# Patient Record
Sex: Female | Born: 1970 | Race: White | Hispanic: No | Marital: Married | Smoking: Never smoker
Health system: Southern US, Community
[De-identification: ages and names within clinical notes are randomized; demographics above are authoritative.]

## PROBLEM LIST (undated history)

## (undated) DIAGNOSIS — E039 Hypothyroidism, unspecified: Secondary | ICD-10-CM

## (undated) DIAGNOSIS — Z8619 Personal history of other infectious and parasitic diseases: Secondary | ICD-10-CM

## (undated) DIAGNOSIS — O09529 Supervision of elderly multigravida, unspecified trimester: Secondary | ICD-10-CM

## (undated) HISTORY — DX: Supervision of elderly multigravida, unspecified trimester: O09.529

## (undated) HISTORY — DX: Personal history of other infectious and parasitic diseases: Z86.19

## (undated) HISTORY — DX: Hypothyroidism, unspecified: E03.9

---

## 2008-01-06 ENCOUNTER — Encounter: Admission: RE | Admit: 2008-01-06 | Discharge: 2008-01-06 | Payer: Self-pay | Admitting: Family Medicine

## 2008-03-27 ENCOUNTER — Encounter: Admission: RE | Admit: 2008-03-27 | Discharge: 2008-03-27 | Payer: Self-pay | Admitting: Family Medicine

## 2009-02-12 ENCOUNTER — Encounter: Admission: RE | Admit: 2009-02-12 | Discharge: 2009-02-12 | Payer: Self-pay | Admitting: Gastroenterology

## 2009-08-23 ENCOUNTER — Ambulatory Visit (HOSPITAL_COMMUNITY): Admission: RE | Admit: 2009-08-23 | Discharge: 2009-08-23 | Payer: Self-pay | Admitting: Obstetrics and Gynecology

## 2010-12-18 ENCOUNTER — Inpatient Hospital Stay (HOSPITAL_COMMUNITY)
Admission: AD | Admit: 2010-12-18 | Discharge: 2010-12-22 | DRG: 371 | Disposition: A | Discharge status: Home / Self Care | Payer: BC Managed Care – PPO | Source: Ambulatory Visit | Attending: Obstetrics and Gynecology | Admitting: Obstetrics and Gynecology

## 2010-12-18 DIAGNOSIS — O09519 Supervision of elderly primigravida, unspecified trimester: Secondary | ICD-10-CM | POA: Diagnosis present

## 2010-12-18 DIAGNOSIS — O480 Post-term pregnancy: Principal | ICD-10-CM | POA: Diagnosis present

## 2010-12-18 LAB — CBC
HCT: 37.5 % (ref 36.0–46.0)
Hemoglobin: 12.6 g/dL (ref 12.0–15.0)
MCH: 32.5 pg (ref 26.0–34.0)
MCHC: 33.6 g/dL (ref 30.0–36.0)
MCV: 96.6 fL (ref 78.0–100.0)

## 2010-12-19 ENCOUNTER — Inpatient Hospital Stay (HOSPITAL_COMMUNITY): Admission: AD | Admit: 2010-12-19 | Payer: Self-pay | Admitting: Obstetrics and Gynecology

## 2010-12-20 LAB — CBC
HCT: 34.3 % — ABNORMAL LOW (ref 36.0–46.0)
MCHC: 33.2 g/dL (ref 30.0–36.0)
MCV: 96.3 fL (ref 78.0–100.0)
RDW: 13.1 % (ref 11.5–15.5)

## 2010-12-21 NOTE — Op Note (Signed)
Stacey, Cuevas NO.:  [id number]  MEDICAL RECORD NO.:  [id number]           PATIENT TYPE:  I  LOCATION:  9146                          FACILITY:  [facility]  PHYSICIAN:  Dineen Kid. Rana Snare, M.D.    DATE OF BIRTH:  [birth year]  DATE OF PROCEDURE:  12/19/2010 DATE OF DISCHARGE:                              OPERATIVE REPORT   PREOPERATIVE DIAGNOSIS:  Intrauterine pregnancy at [redacted] weeks gestational age, failure to progress with arrest of dilation.  POSTOPERATIVE DIAGNOSIS:  Intrauterine pregnancy at [redacted] weeks gestational age, failure to progress with arrest of dilation, occiput posterior presentation.  PROCEDURE:  Primary low-segment transverse cesarean section.  SURGEON:  Dineen Kid. Rana Snare, MD  ANESTHESIA:  Epidural.  INDICATIONS:  [name] is a 40 year old who was induced 40-1/7th weeks. She underwent a two-stage induction.  She progressed to 4 cm dilated and despite 5-6 hours of adequate labor, she had no further progression beyond 4 cm dilated.  Because of failure to progress with arrest of dilation, we proceeded with primary low-segment transverse cesarean sections.  The risks and benefits were discussed.  Informed consent was obtained cancer.  FINDINGS AT THE TIME OF SURGERY:  A viable female infant, Apgars were 8 and 9, pH arterial 7.34, weight was 8 pounds and 7 ounces.  The baby was in the occiput posterior presentation.  DESCRIPTION OF PROCEDURE:  After adequate analgesia, the patient was placed in the supine position with left lateral tilt.  She was sterilely prepped and draped.  The bladder was sterilely drained with Foley catheter.  A Pfannenstiel skin incision was made 2 fingerbreadths above the pubic symphysis, taken down sharply to fascia, which was incised transversely, extended superiorly and inferiorly off the bellies.  The rectus muscle was separated sharply in the midline.  Peritoneum was entered sharply.  Bladder flap was created and a  self-retaining ring was placed.  A low-segment myotomy incision was made down to the amniotic sac, extended laterally with the operator's fingertips.  The infant's vertex was delivered through the myotomy incision and easily delivered with vacuum extractor.  The nares and pharynx were then suctioned.  The infant was delivered, cord clamped and cut, and handed to pediatricians for resuscitation.  Cord blood was obtained.  Placenta was extracted manually.  Uterus was exteriorized and wiped clean with a dry lap.  The myotomy incision was closed in 2 layers, first being running locking layer with the second being imbricating layer of 0 Monocryl suture. Uterus was massaged until firm given Pitocin, was also given IM Methergine 0.2 mg to achieve good uterine tone.  Uterus was placed back into the abdominal cavity and after copious amount of irrigation, adequate hemostasis was assured.  The peritoneum was closed with 0 Monocryl suture.  Rectus muscle was plicated in the midline.  Irrigation was applied and after adequate hemostasis, the fascia was then closed with a #1 Vicryl in a running fashion.  Irrigation was applied and after adequate hemostasis, the skin stapled.  The patient was tolerated the procedure well, was stable, and transferred to recovery room.  The sponge and  instrument counts were normal x3.  Estimated blood loss 800 mL.  The patient received 1 g of cefotetan preoperatively.     Dineen Kid Rana Snare, M.D.     DCL/MEDQ  D:  12/19/2010  T:  12/20/2010  Job:  [id number]  Electronically Signed by Candice Camp M.D. on 12/21/2010 11:34:08 AM

## 2011-02-03 NOTE — Discharge Summary (Signed)
Stacey Cuevas, Stacey Cuevas NO.:  [id number]  MEDICAL RECORD NO.:  [id number]           PATIENT TYPE:  I  LOCATION:  9146                          FACILITY:  [facility]  PHYSICIAN:  Guy Sandifer. Henderson Cloud, M.D. DATE OF BIRTH:  April 04, 1971  DATE OF ADMISSION:  12/19/2010 DATE OF DISCHARGE:  12/22/2010                              DISCHARGE SUMMARY   ADMITTING DIAGNOSES: 1. Intrauterine pregnancy at 40-1/2 weeks' estimated gestational age. 2. Induction of labor secondary to postdates.  DISCHARGE DIAGNOSES: 1. Status post low transverse cesarean section secondary to failure to     progress. 2. Viable female infant.  PROCEDURE:  Low transverse cesarean section.  REASON FOR ADMISSION:  Please see written H and P.  HOSPITAL COURSE:  The patient is a 40 year old primigravida that was admitted to [facility] for induction of labor secondary to postdates.  Pregnancy had otherwise been uncomplicated.  On the morning of admission, fetal heart tones were in the 140s with nice acceleration and contractions were approximately every 2-4 minutes. Cervix was examined and found to be 1-2 cm, 70% effaced, vertex at -2 station.  Artificial rupture of membranes was performed which revealed clear fluid.  The patient was started on Pitocin and epidural was administered for her comfort.  Later that evening, there was no further change in the cervix beyond 4 cm and adequate labor had been determined by intrauterine pressure catheter.  Decision was made to proceed with a primary low transverse cesarean section.  The patient was then transferred to the operating room where epidural was dosed to an adequate surgical level.  A low transverse incision was made with delivery of a viable female infant weighing 8 pounds and 7 ounces with Apgars of 8 at 1 minute and 9 at 5 minutes.  Arterial cord pH was 7.34. The patient tolerated this well and was taken to the recovery room in stable condition.   On postoperative day #1, the patient was without complaint.  Vital signs were stable.  Abdominal dressing was noted to be clean, dry, and intact.  Laboratory findings showed hemoglobin of 11.4. On postoperative day #2, the patient was without complaint.  Vital signs remained stable.  Fundus was firm and nontender.  Incision was clean, dry, and intact.  She is ambulating well.  On postoperative day #3, the patient was without complaint.  Vital signs remained stable.  Fundus was firm and nontender.  Staples were removed and the patient was later discharged home.  CONDITION ON DISCHARGE:  Stable.  DIET:  Regular as tolerated.  ACTIVITY:  No heavy lifting, no driving x2 weeks, no vaginal entry.  FOLLOWUP:  The patient is follow up in the office in 1-2 weeks for an incision check.  She is to call for temperature greater than 100 degrees, persistent nausea and vomiting, heavy vaginal bleeding, and/or redness or drainage from incisional site.  DISCHARGE MEDICATIONS:  Percocet 5-325, #30, 1 p.o. every 4-6 h. p.r.n., Motrin 600 mg every 6 hours, and prenatal vitamins 1 p.o. daily.     Julio Sicks, N.P.   ______________________________ Guy Sandifer. Henderson Cloud,  M.D.    CC/MEDQ  D:  12/22/2010  T:  12/23/2010  Job:  [id number]  Electronically Signed by Julio Sicks N.P. on 12/24/2010 08:57:07 AM Electronically Signed by Harold Hedge M.D. on 02/03/2011 01:44:34 PM

## 2012-03-25 ENCOUNTER — Other Ambulatory Visit: Payer: Self-pay

## 2012-03-25 LAB — OB RESULTS CONSOLE HEPATITIS B SURFACE ANTIGEN: Hepatitis B Surface Ag: NEGATIVE

## 2012-03-25 LAB — OB RESULTS CONSOLE RUBELLA ANTIBODY, IGM: Rubella: IMMUNE

## 2012-03-25 LAB — OB RESULTS CONSOLE ABO/RH: RH Type: POSITIVE

## 2012-03-25 LAB — OB RESULTS CONSOLE ANTIBODY SCREEN: Antibody Screen: NEGATIVE

## 2012-04-06 ENCOUNTER — Encounter: Payer: Self-pay | Admitting: Obstetrics and Gynecology

## 2012-08-22 ENCOUNTER — Other Ambulatory Visit (HOSPITAL_COMMUNITY): Payer: Self-pay | Admitting: Obstetrics and Gynecology

## 2012-08-22 DIAGNOSIS — O283 Abnormal ultrasonic finding on antenatal screening of mother: Secondary | ICD-10-CM

## 2012-08-22 DIAGNOSIS — O2690 Pregnancy related conditions, unspecified, unspecified trimester: Secondary | ICD-10-CM

## 2012-08-25 ENCOUNTER — Encounter (HOSPITAL_COMMUNITY): Payer: Self-pay

## 2012-08-25 ENCOUNTER — Ambulatory Visit (HOSPITAL_COMMUNITY)
Admission: RE | Admit: 2012-08-25 | Discharge: 2012-08-25 | Disposition: A | Discharge status: Home / Self Care | Payer: Commercial Managed Care - PPO | Source: Ambulatory Visit | Attending: Obstetrics | Admitting: Obstetrics

## 2012-08-25 ENCOUNTER — Ambulatory Visit (HOSPITAL_COMMUNITY)
Admission: RE | Admit: 2012-08-25 | Discharge: 2012-08-25 | Disposition: A | Discharge status: Home / Self Care | Payer: Commercial Managed Care - PPO | Source: Ambulatory Visit | Attending: Obstetrics and Gynecology | Admitting: Obstetrics and Gynecology

## 2012-08-25 VITALS — BP 116/61 | HR 89 | Wt 166.5 lb

## 2012-08-25 DIAGNOSIS — O283 Abnormal ultrasonic finding on antenatal screening of mother: Secondary | ICD-10-CM

## 2012-08-25 DIAGNOSIS — IMO0002 Reserved for concepts with insufficient information to code with codable children: Secondary | ICD-10-CM

## 2012-08-25 DIAGNOSIS — O34219 Maternal care for unspecified type scar from previous cesarean delivery: Secondary | ICD-10-CM | POA: Insufficient documentation

## 2012-08-25 DIAGNOSIS — E039 Hypothyroidism, unspecified: Secondary | ICD-10-CM | POA: Insufficient documentation

## 2012-08-25 DIAGNOSIS — O2690 Pregnancy related conditions, unspecified, unspecified trimester: Secondary | ICD-10-CM

## 2012-08-25 DIAGNOSIS — O358XX0 Maternal care for other (suspected) fetal abnormality and damage, not applicable or unspecified: Secondary | ICD-10-CM | POA: Insufficient documentation

## 2012-08-25 DIAGNOSIS — E079 Disorder of thyroid, unspecified: Secondary | ICD-10-CM | POA: Insufficient documentation

## 2012-08-25 DIAGNOSIS — O09529 Supervision of elderly multigravida, unspecified trimester: Secondary | ICD-10-CM | POA: Insufficient documentation

## 2012-08-25 DIAGNOSIS — Z363 Encounter for antenatal screening for malformations: Secondary | ICD-10-CM | POA: Insufficient documentation

## 2012-08-25 DIAGNOSIS — Z1389 Encounter for screening for other disorder: Secondary | ICD-10-CM | POA: Insufficient documentation

## 2012-08-25 NOTE — Progress Notes (Signed)
[facility] ultrasound  Indication: 41 yr old G2P1001 at [redacted]w[redacted]d for ultrasound secondary to finding of enlarged cavum septum pellucidum on outside ultrasound.  Findings: 1. Single intrauterine pregnancy. 2. Estimated fetal weight is in the 52nd%. 3. Anterior placenta with a posterior accessory lobe. 4.  Normal amniotic fluid volume. 5. The anatomic survey is limited by advanced gestational age. No abnormalities were seen. The cavum septum pellucidum looks prominent but likely a normal variant.  Recommendations: 1. Appropriate fetal growth. 2. See consult letter. 3. Advanced maternal age: - patient met with genetic counselor; see separate report - had normal MaterniT21 screen 4. Recommend follow up in 6 weeks to reevaluate the fetal brain.  Eulis Foster, MD

## 2012-08-25 NOTE — Consult Note (Signed)
MFM consult  41 yr old G2P1001 at [redacted]w[redacted]d referred by Dr. Rana Snare for ultrasound and consult secondary to finding of enlarged cavum septum pellucidum on outside ultrasound.   Ultrasound today shows: estimated fetal weight is in the 52nd%. Anterior placenta with a posterior accessory lobe. Normal amniotic fluid volume. The anatomic survey is limited by advanced gestational age. No abnormalities were seen. The cavum septum pellucidum looks prominent but likely a normal variant.    I discussed the findings of the ultrasound with the patient and counseled her as follows:   1. Appropriate fetal growth.  2. Discussed that while the cavum septum pellucidum looks prominent on ultrasound today I am not aware of limits of normal fetal measurements nor am I aware of any association with an "enlarged" cavum septum pellucidum. Discussed that this is not "water on the brain" as that typically refers to ventriculomegaly and I discussed the ventricles appear normal on today's ultrasound. Discussed there are remote possibilities of an intracranial cyst but the appearance of this structure is consistent with the cavum septum pellucidum. Will reevaluate in 6 weeks. 3. Advanced maternal age:  - patient met with genetic counselor; see separate report  - had normal MaterniT21 screen - recommend repeat fetal growth in 4-6 weeks - recommend antenatal testing with twice weekly nonstress tests and weekly amniotic fluid index starting at [redacted] weeks gestation  4. Recommend follow up in 6 weeks to reevaluate the fetal brain.   I spent 40 minutes in face to face consultation with the patient in addition to time spent on the ultrasound.  Eulis Foster, MD

## 2012-08-29 NOTE — Progress Notes (Signed)
Genetic Counseling  High-Risk Gestation Note  Appointment Date:  08/25/2012 Referred By: Stacey Hedge MD Date of Birth:  07-Jan-1971    Pregnancy History: G2P1001 Estimated Date of Delivery: 11/01/12 Estimated Gestational Age: [redacted]w[redacted]d Attending: Eulis Foster, MD  [name], and her husband were seen for genetic counseling regarding a maternal age of 41 y.o. and previous ultrasound finding of enlarged cavum septum pellucidum.   Ultrasound performed at the time of today confirmed the pregnancy to be [redacted]w[redacted]d gestation. The cavum septum pellucidum was visualized to appear prominent but expected to be likely a normal variant. Complete ultrasound results and MFM consult note reported separately. A follow-up ultrasound was recommended to be offered to reevaluate the fetal brain in 6 weeks. We are happy to perform this in our office, if desired. No follow-up appointments were made in our office at this time.   We discussed that an absence cavum septum pellucidum is associated with an increased risk for fetal aneuploidy. However, an enlarged cavum septum pellucidum has not been found to have the same association. We reviewed with the couple that the appearance of the cavum septum pellucidum on today's ultrasound is not associated with increased fluid in the fetal brain and is most likely a normal variant. The patient inquired about additional fluid on the fetal brain. We reviewed that this typically refers to ventriculomegaly or hydrocephalus, which is not visualized in the current pregnancy. Follow-up ultrasound is available in 6 weeks to reassess fetal brain anatomy.    We reviewed maternal age and the association with risk for chromosome conditions due to nondisjunction with aging of the ova.  They were counseled that the risk for aneuploidy decreases as gestational age increases, accounting for those pregnancies which spontaneously abort.  We specifically discussed Down syndrome (trisomy 34), trisomies  60 and 74, and sex chromosome aneuploidies (47,XXX and 47,XXY) including the common features and prognoses of each. [name] previously have noninvasive prenatal testing (NIPT) performed through her OB office (MaterniT21), which was negative for the conditions screened. We reviewed that NIPT analyzes cell free fetal DNA found in the maternal circulation. This test is not diagnostic for chromosome conditions, but can provide information regarding the presence or absence of extra fetal DNA for chromosomes 13, 18 and 21. Thus, it would not identify or rule out all fetal aneuploidy. The reported detection rate is greater than 99% for Trisomy 21, greater than 97% for Trisomy 18, and is approximately 91% for Trisomy 13. The false positive rate is reported to be less than 0.1% for any of these conditions. In addition, we discussed that ~50-80% of fetuses with Down syndrome and up to 90-95% of fetuses with trisomy 18/13, when well visualized, have detectable anomalies or soft markers by detailed ultrasound (~18+ weeks gestation).   This couple was also counseled regarding the option of diagnostic testing via amniocentesis. We reviewed the approximate 1 in 300-500 risk for complications, including spontaneous preterm labor and delivery at the current gestational age. We reviewed that amniocentesis typically assess for fetal karyotype. Single gene conditions are typically not tested for via amniocentesis unless ultrasound findings and/or family history suggest a risk for a particular genetic condition, for which prenatal diagnosis is available. Thus, amniocentesis would not identify or rule out all genetic conditions. After consideration of all the options, they declined amniocentesis at this time.   Both family histories were reviewed and found to be contributory for dry skin/eczema for the father of the pregnancy, the couple's 11 month old  son, and paternal relatives for the father of the pregnancy. The father of  the pregnancy reportedly has eczema and asthma. Eczema has been associated with asthma and allergies. Though the underlying cause is currently not known, eczema has been suggested to follow autosomal dominant inheritance with variable expressivity, in some cases. This means, in some families, an individual with eczema has a 1 in 2 (50%) chance of passing on the gene change that could lead to eczema in offspring, though not all who inherit the gene change would develop the same symptoms. Eczema can also be seen as part of an underlying genetic syndrome. Given this, the chance for eczema, asthma, and/or allergies in the current pregnancy could be up to 50%. We discussed that prenatal screening or testing for eczema, asthma, or allergies is not available in the absence of a known genetic cause. It would be helpful for the patient to inform her pediatrician of this history so that her child(ren) can be screened and followed appropriately. Without further information regarding the provided family history, an accurate genetic risk cannot be calculated. Further genetic counseling is warranted if more information is obtained.  [name] denied exposure to environmental toxins or chemical agents. She denied the use of alcohol, tobacco or street drugs. She denied significant viral illnesses during the course of her pregnancy. Her medical and surgical histories were noncontributory.    I counseled this couple regarding the above risks and available options.  The approximate face-to-face time with the genetic counselor was 20 minutes.  Stacey Plowman, MS Certified Genetic Counselor  08/29/2012

## 2012-10-06 ENCOUNTER — Ambulatory Visit (HOSPITAL_COMMUNITY)
Admission: RE | Admit: 2012-10-06 | Discharge: 2012-10-06 | Disposition: A | Discharge status: Home / Self Care | Payer: 59 | Source: Ambulatory Visit | Attending: Obstetrics | Admitting: Obstetrics

## 2012-10-06 DIAGNOSIS — O34219 Maternal care for unspecified type scar from previous cesarean delivery: Secondary | ICD-10-CM | POA: Insufficient documentation

## 2012-10-06 DIAGNOSIS — O283 Abnormal ultrasonic finding on antenatal screening of mother: Secondary | ICD-10-CM

## 2012-10-06 DIAGNOSIS — O358XX0 Maternal care for other (suspected) fetal abnormality and damage, not applicable or unspecified: Secondary | ICD-10-CM | POA: Insufficient documentation

## 2012-10-06 DIAGNOSIS — E079 Disorder of thyroid, unspecified: Secondary | ICD-10-CM | POA: Insufficient documentation

## 2012-10-06 DIAGNOSIS — O09529 Supervision of elderly multigravida, unspecified trimester: Secondary | ICD-10-CM | POA: Insufficient documentation

## 2012-10-06 DIAGNOSIS — IMO0002 Reserved for concepts with insufficient information to code with codable children: Secondary | ICD-10-CM

## 2012-10-06 DIAGNOSIS — O99280 Endocrine, nutritional and metabolic diseases complicating pregnancy, unspecified trimester: Secondary | ICD-10-CM | POA: Insufficient documentation

## 2012-10-06 DIAGNOSIS — E039 Hypothyroidism, unspecified: Secondary | ICD-10-CM | POA: Insufficient documentation

## 2012-10-06 LAB — OB RESULTS CONSOLE GBS: GBS: NEGATIVE

## 2012-10-06 IMAGING — US US OB FOLLOW-UP
1 series · 12 of 28 positions shown · non-contrast
Comparison: none

[Series 1: us ob follow-up · 0.22mm/px · 12 of 42 slices shown]
[im 2/42]
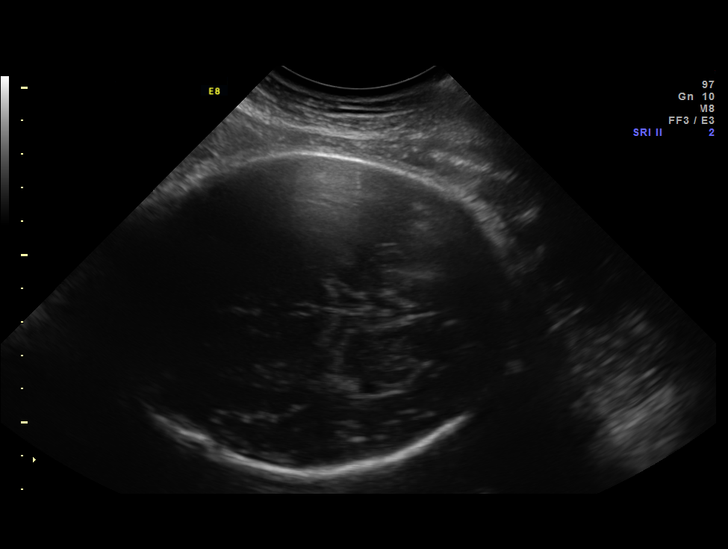
[im 5/42]
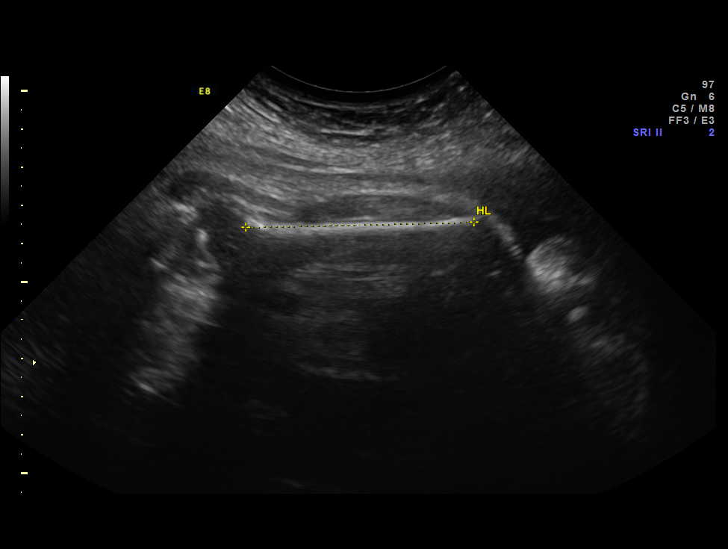
[im 8/42]
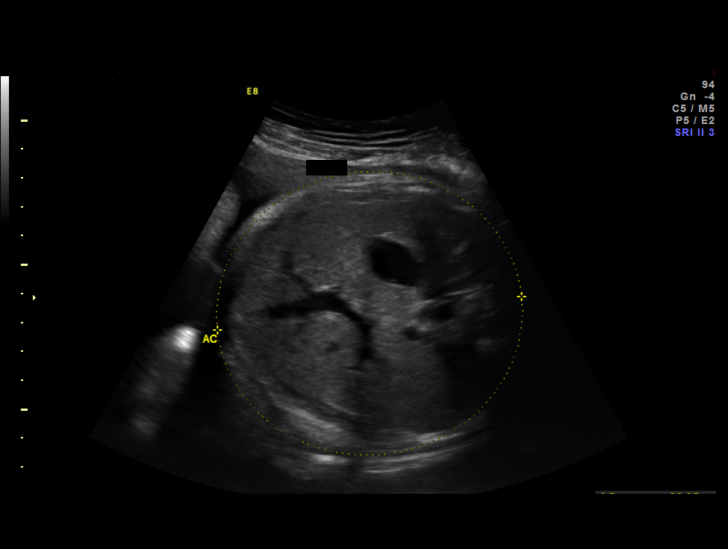
[im 13/42]
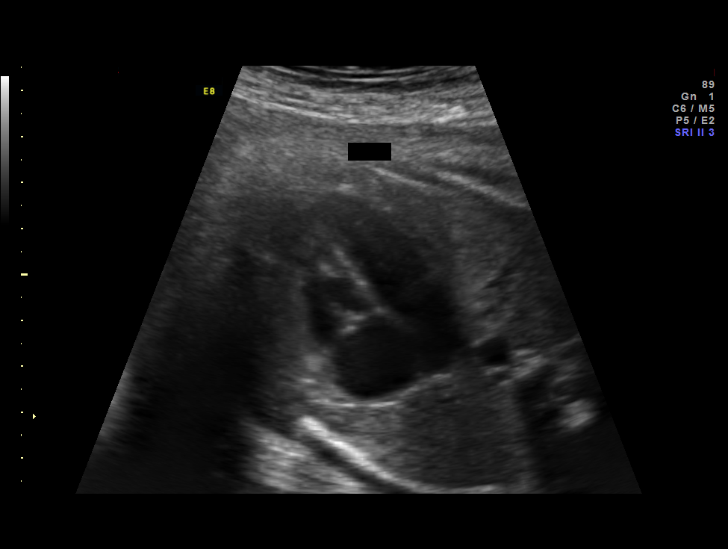
[im 16/42]
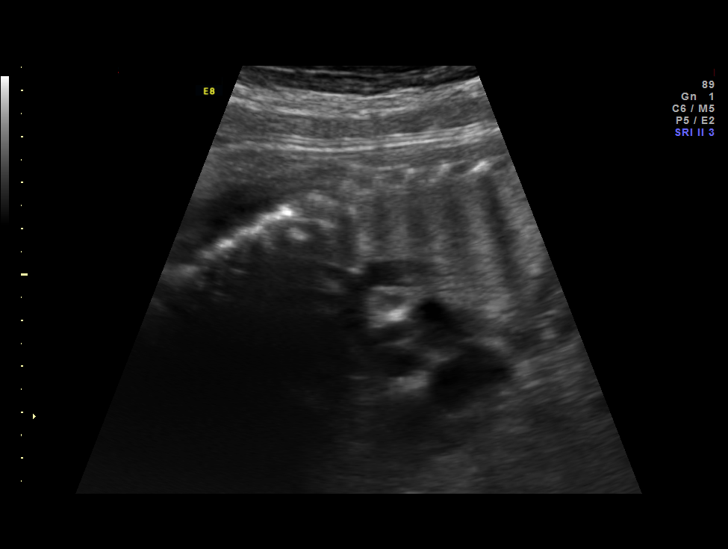
[im 19/42]
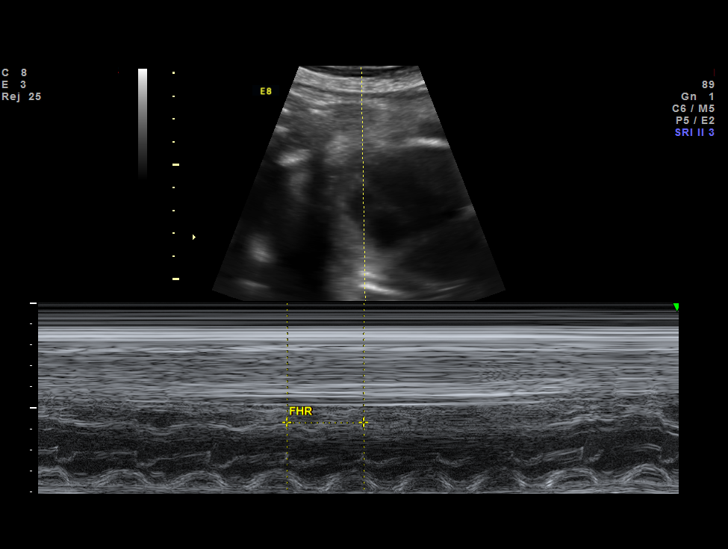
[im 23/42]
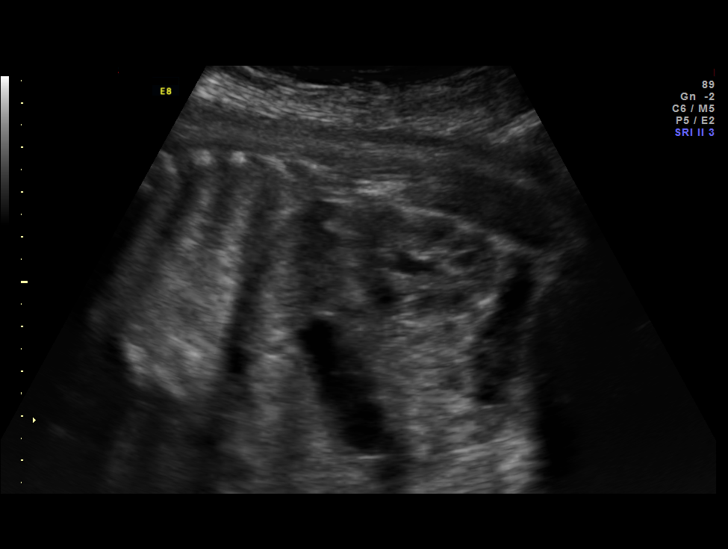
[im 26/42]
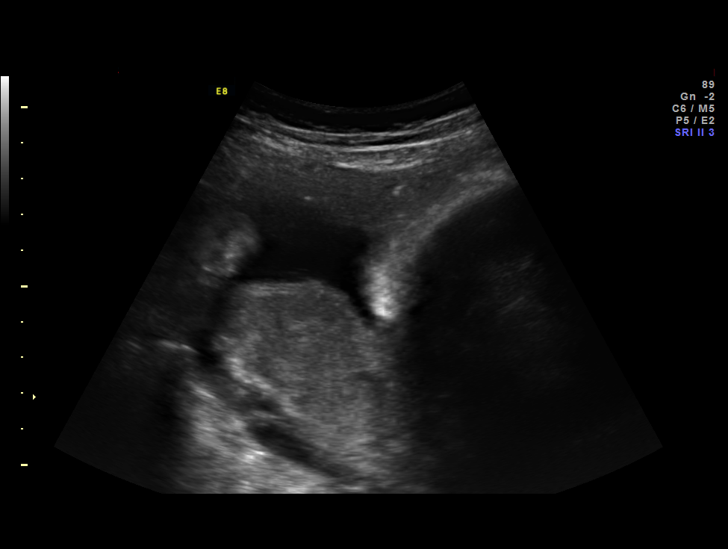
[im 29/42]
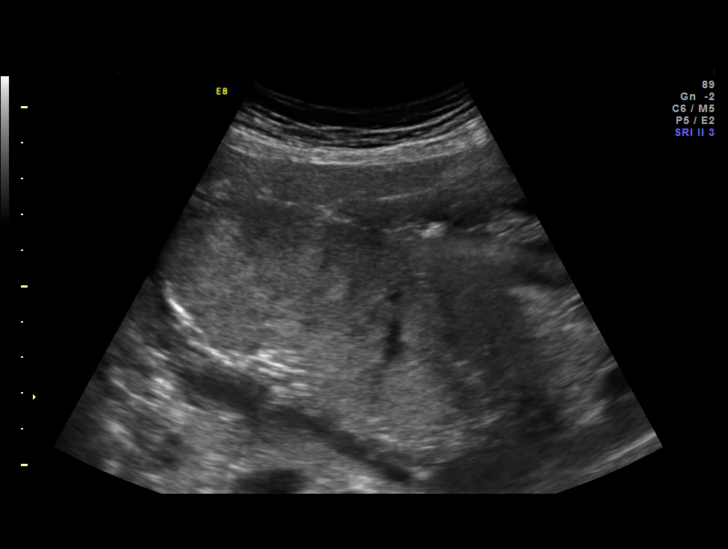
[im 34/42]
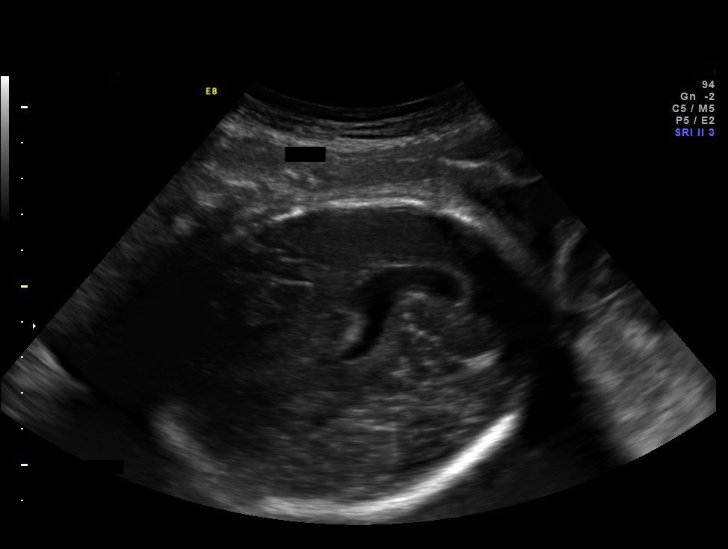
[im 37/42]
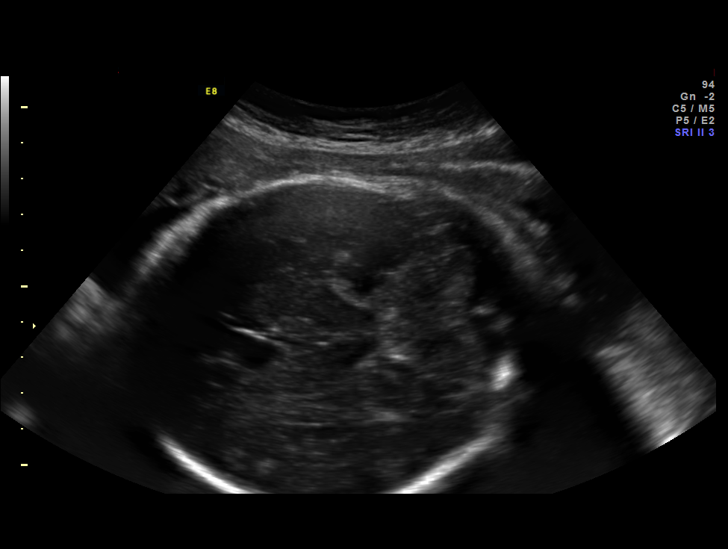
[im 40/42]
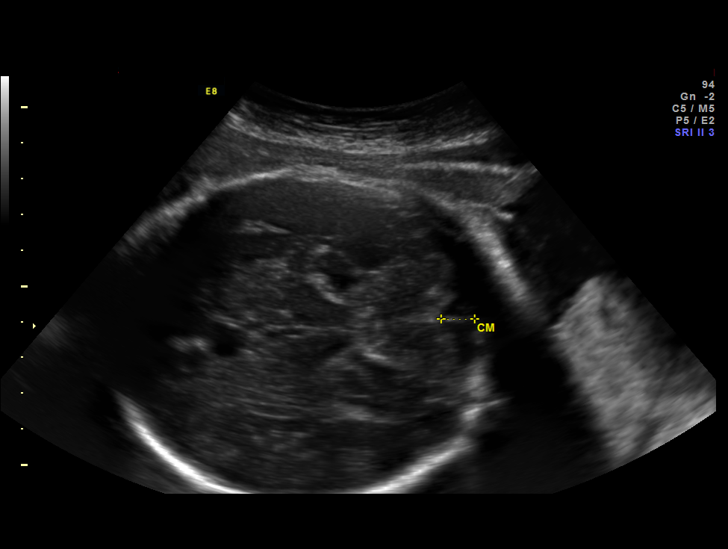

[12 of 28 positions shown; findings below may reference images not displayed]

OBSTETRICS REPORT
                      (Signed Final [DATE] [DATE])

Service(s) Provided

 US OB FOLLOW UP                                       76816.1
Indications

 Fetal abnormality - other known or suspected
 (enlarged CSP)
 Advanced maternal age (AMA), Multigravida
 Hypothyroid
 Previous cesarean section
Fetal Evaluation

 Num Of Fetuses:    1
 Preg. Location:    Intrauterine
 Fetal Heart Rate:  131                          bpm
 Cardiac Activity:  Observed
 Presentation:      Breech
 Placenta:          Fundal, above cervical os

 Amniotic Fluid
 AFI FV:      Subjectively within normal limits
                                             Larg Pckt:    3.31  cm
Biometry

 BPD:     95.7  mm     G. Age:  39w 0d                CI:         75.1   70 - 86
 OFD:    127.5  mm                                    FL/HC:      18.6   20.1 -

 HC:     354.2  mm     G. Age:  41w 3d     > 97  %    HC/AC:      1.11   0.93 -

 AC:     320.5  mm     G. Age:  36w 0d       59  %    FL/BPD:     69.0   71 - 87
 FL:        66  mm     G. Age:  34w 0d        7  %    FL/AC:      20.6   20 - 24
 HUM:     59.3  mm     G. Age:  34w 2d       35  %

 Est. FW:    [WC]  gm      6 lb 7 oz     72  %
Gestational Age

 LMP:           36w 0d        Date:  [DATE]                 EDD:   [DATE]
 U/S Today:     37w 4d                                        EDD:   [DATE]
 Best:          36w 0d     Det. By:  LMP  ([DATE])          EDD:   [DATE]
Anatomy
 Cranium:          Appears normal         Aortic Arch:      Appears normal
 Fetal Cavum:      Appears normal         Ductal Arch:      Appears normal
 Ventricles:       Appears normal         Diaphragm:        Appears normal
 Choroid Plexus:   Previously seen        Stomach:          Appears normal, left
                                                            sided
 Cerebellum:       Appears normal         Abdomen:          Appears normal
 Posterior Fossa:  Appears normal         Abdominal Wall:   Not well visualized
 Nuchal Fold:      Not applicable (>20    Cord Vessels:     Previously seen
                   wks GA)
 Face:             Orbits and profile     Kidneys:          Appear normal
                   previously seen
 Lips:             Previously seen        Bladder:          Appears normal
 Heart:            Appears normal         Spine:            Previously seen
                   (4CH, axis, and
                   situs)
 RVOT:             Appears normal         Lower             Previously seen
                                          Extremities:
 LVOT:             Appears normal         Upper             Previously seen
                                          Extremities:

 Other:  Fetus appears to be a male.
Targeted Anatomy

 Fetal Central Nervous System
 Cisterna Magna:
Cervix Uterus Adnexa

 Cervix:       Not visualized (advanced GA >34 wks)
 Uterus:       No abnormality visualized.

 Adnexa:     No abnormality visualized.
Impression

 Single IUP at 36 0/7 weeks
 Interval growth is appropriate (72nd %tile)
 A prominent fetal cavum is again noted - most likely a normal
 anatomic variant.
 Cranial anatomy is otherwise within normal limits.  The lateral
 ventricles are normal.
 Breech presentation
 Normal amniotic fluid volume
Recommendations

 Follow-up ultrasounds as clinically indicated.
 Recommend 2x weekly NSTs due to advanced maternal age

 questions or concerns.

## 2012-10-06 NOTE — Progress Notes (Signed)
Stacey Cuevas  was seen today for an ultrasound appointment.  See full report in [facility].  Impression: Single IUP at 36 0/7 weeks Interval growth is appropriate (72nd %tile) A prominent fetal cavum is again noted - most likely a normal anatomic variant. Cranial anatomy is otherwise within normal limits.  The lateral ventricles are normal. Breech presentation Normal amniotic fluid volume  Recommendations: Follow-up ultrasounds as clinically indicated.  Recommend 2x weekly NSTs due to advanced maternal age  Alpha Gula, MD

## 2012-10-12 ENCOUNTER — Telehealth (HOSPITAL_COMMUNITY): Payer: Self-pay | Admitting: *Deleted

## 2012-10-12 ENCOUNTER — Encounter (HOSPITAL_COMMUNITY): Payer: Self-pay | Admitting: *Deleted

## 2012-10-12 NOTE — Telephone Encounter (Signed)
Preadmission screen  

## 2012-10-13 ENCOUNTER — Encounter (HOSPITAL_COMMUNITY): Payer: Self-pay | Admitting: *Deleted

## 2012-10-14 ENCOUNTER — Telehealth (HOSPITAL_COMMUNITY): Payer: Self-pay | Admitting: *Deleted

## 2012-10-17 ENCOUNTER — Inpatient Hospital Stay (HOSPITAL_COMMUNITY): Admission: RE | Admit: 2012-10-17 | Payer: 59 | Source: Ambulatory Visit

## 2012-11-02 ENCOUNTER — Encounter (HOSPITAL_COMMUNITY): Payer: Self-pay | Admitting: Pharmacist

## 2012-11-02 ENCOUNTER — Encounter (HOSPITAL_COMMUNITY): Payer: Self-pay

## 2012-11-02 ENCOUNTER — Encounter (HOSPITAL_COMMUNITY)
Admission: RE | Admit: 2012-11-02 | Discharge: 2012-11-02 | Disposition: A | Discharge status: Home / Self Care | Payer: 59 | Source: Ambulatory Visit | Attending: Obstetrics and Gynecology | Admitting: Obstetrics and Gynecology

## 2012-11-02 NOTE — H&P (Addendum)
Stacey Cuevas is a 42 y.o. female presenting for repeat c/s.  Prev [facility] wanted TOL for VBAC but baby is breech.  Preg also followed at [facility] for prominent fetal septum pellucidum that they think is just normal variant.  Normal Materni 21 and GBS neg. History OB History   Grav Para Term Preterm Abortions TAB SAB Ect Mult Living   2 1 1  0 0 0 0 0 0 1     Past Medical History  Diagnosis Date  . Hx of varicella   . AMA (advanced maternal age) multigravida 35+   . Hypothyroidism    Past Surgical History  Procedure Laterality Date  . Cesarean section     Family History: family history includes Cancer in her father, mother, and paternal uncle and Hypertension in her father. Social History:  reports that she has never smoked. She has never used smokeless tobacco. She reports that she does not drink alcohol or use illicit drugs.   Prenatal Transfer Tool  Maternal Diabetes: No Genetic Screening: Normal Maternal Ultrasounds/Referrals: Normal Fetal Ultrasounds or other Referrals:  Referred to Materal Fetal Medicine see above Maternal Substance Abuse:  No Significant Maternal Medications:  Meds include: Syntroid Significant Maternal Lab Results:  None Other Comments:  None  ROS    Last menstrual period 01/26/2012. Exam Physical Exam   Cx Cl/th/ high  breech Prenatal labs: ABO, Rh: B/Positive/-- (07/05 0000) Antibody: Negative (07/05 0000) Rubella: Immune (07/05 0000) RPR: Nonreactive (07/05 0000)  HBsAg: Negative (07/05 0000)  HIV: Non-reactive (07/05 0000)  GBS: Negative (01/16 0000)   Assessment/Plan: IUP at term with prev C/S and Breech for repeat LSCTS Risks and benefits of C/S were discussed.  All questions were answered and informed consent was obtained.  Plan to proceed with low segment transverse Cesarean Section.   Mahealani Sulak C 11/02/2012, 12:06 PM    11/04/12 0715 This patient has been seen and examined.   All of her questions were answered.  Labs and vital signs  reviewed.  Informed consent has been obtained.  The History and Physical is current. DL

## 2012-11-02 NOTE — Patient Instructions (Addendum)
20 Lien Lyman  11/02/2012   Your procedure is scheduled on:  11/04/12  Enter through the Main Entrance of [facility] at 6 AM.  Pick up the phone at the desk and dial [phone].   Call this number if you have problems the morning of surgery: [phone]   Remember:   Do not eat food:After Midnight.  Do not drink clear liquids: After Midnight.  Take these medicines the morning of surgery with A SIP OF WATER: Thyroid medication   Do not wear jewelry, make-up or nail polish.  Do not wear lotions, powders, or perfumes. You may wear deodorant.  Do not shave 48 hours prior to surgery.  Do not bring valuables to the hospital.  Contacts, dentures or bridgework may not be worn into surgery.  Leave suitcase in the car. After surgery it may be brought to your room.  For patients admitted to the hospital, checkout time is 11:00 AM the day of discharge.   Patients discharged the day of surgery will not be allowed to drive home.  Name and phone number of your driver: NA  Special Instructions: Shower using CHG 2 nights before surgery and the night before surgery.  If you shower the day of surgery use CHG.  Use special wash - you have one bottle of CHG for all showers.  You should use approximately 1/3 of the bottle for each shower.   Please read over the following fact sheets that you were given: Surgical Site Infection Prevention

## 2012-11-03 LAB — RPR: RPR Ser Ql: NONREACTIVE

## 2012-11-03 MED ORDER — DEXTROSE 5 % IV SOLN
2.0000 g | INTRAVENOUS | Status: AC
  Administered 2012-11-04: 2 g via INTRAVENOUS
  Filled 2012-11-03: qty 2

## 2012-11-04 ENCOUNTER — Inpatient Hospital Stay (HOSPITAL_COMMUNITY): Payer: 59 | Admitting: Anesthesiology

## 2012-11-04 ENCOUNTER — Encounter (HOSPITAL_COMMUNITY): Payer: Self-pay | Admitting: Anesthesiology

## 2012-11-04 ENCOUNTER — Inpatient Hospital Stay (HOSPITAL_COMMUNITY)
Admission: RE | Admit: 2012-11-04 | Discharge: 2012-11-06 | DRG: 766 | Disposition: A | Discharge status: Home / Self Care | Payer: 59 | Source: Ambulatory Visit | Attending: Obstetrics and Gynecology | Admitting: Obstetrics and Gynecology

## 2012-11-04 ENCOUNTER — Encounter (HOSPITAL_COMMUNITY)
Admission: RE | Disposition: A | Discharge status: Home / Self Care | Payer: Self-pay | Source: Ambulatory Visit | Attending: Obstetrics and Gynecology

## 2012-11-04 DIAGNOSIS — E039 Hypothyroidism, unspecified: Secondary | ICD-10-CM | POA: Diagnosis present

## 2012-11-04 DIAGNOSIS — O321XX0 Maternal care for breech presentation, not applicable or unspecified: Secondary | ICD-10-CM | POA: Diagnosis present

## 2012-11-04 DIAGNOSIS — O34219 Maternal care for unspecified type scar from previous cesarean delivery: Principal | ICD-10-CM | POA: Diagnosis present

## 2012-11-04 DIAGNOSIS — O09529 Supervision of elderly multigravida, unspecified trimester: Secondary | ICD-10-CM | POA: Diagnosis present

## 2012-11-04 DIAGNOSIS — O99284 Endocrine, nutritional and metabolic diseases complicating childbirth: Secondary | ICD-10-CM | POA: Diagnosis present

## 2012-11-04 DIAGNOSIS — E079 Disorder of thyroid, unspecified: Secondary | ICD-10-CM | POA: Diagnosis present

## 2012-11-04 LAB — CBC
Hemoglobin: 11.3 g/dL — ABNORMAL LOW (ref 12.0–15.0)
Platelets: 117 10*3/uL — ABNORMAL LOW (ref 150–400)
RBC: 3.51 MIL/uL — ABNORMAL LOW (ref 3.87–5.11)
WBC: 7 10*3/uL (ref 4.0–10.5)

## 2012-11-04 SURGERY — Surgical Case
Anesthesia: Spinal | Wound class: Clean Contaminated

## 2012-11-04 MED ORDER — OXYTOCIN 10 UNIT/ML IJ SOLN
INTRAMUSCULAR | Status: AC
  Filled 2012-11-04: qty 4

## 2012-11-04 MED ORDER — LACTATED RINGERS IV SOLN
INTRAVENOUS | Status: DC
  Administered 2012-11-04 (×3): via INTRAVENOUS

## 2012-11-04 MED ORDER — PHENYLEPHRINE 40 MCG/ML (10ML) SYRINGE FOR IV PUSH (FOR BLOOD PRESSURE SUPPORT)
PREFILLED_SYRINGE | INTRAVENOUS | Status: AC
  Filled 2012-11-04: qty 5

## 2012-11-04 MED ORDER — OXYTOCIN 40 UNITS IN LACTATED RINGERS INFUSION - SIMPLE MED: 62.5000 mL/h | INTRAVENOUS | Status: AC

## 2012-11-04 MED ORDER — SCOPOLAMINE 1 MG/3DAYS TD PT72
MEDICATED_PATCH | TRANSDERMAL | Status: AC
  Administered 2012-11-04: 1.5 mg via TRANSDERMAL
  Filled 2012-11-04: qty 1

## 2012-11-04 MED ORDER — NALOXONE HCL 0.4 MG/ML IJ SOLN: 0.4000 mg | INTRAMUSCULAR | Status: DC | PRN

## 2012-11-04 MED ORDER — SIMETHICONE 80 MG PO CHEW: 80.0000 mg | CHEWABLE_TABLET | ORAL | Status: DC | PRN

## 2012-11-04 MED ORDER — KETOROLAC TROMETHAMINE 30 MG/ML IJ SOLN
30.0000 mg | Freq: Four times a day (QID) | INTRAMUSCULAR | Status: AC | PRN
  Administered 2012-11-04: 30 mg via INTRAVENOUS
  Filled 2012-11-04: qty 1

## 2012-11-04 MED ORDER — SODIUM CHLORIDE 0.9 % IJ SOLN: 3.0000 mL | INTRAMUSCULAR | Status: DC | PRN

## 2012-11-04 MED ORDER — MORPHINE SULFATE (PF) 0.5 MG/ML IJ SOLN
INTRAMUSCULAR | Status: DC | PRN
  Administered 2012-11-04: .15 mg via INTRATHECAL

## 2012-11-04 MED ORDER — MORPHINE SULFATE 0.5 MG/ML IJ SOLN
INTRAMUSCULAR | Status: AC
  Filled 2012-11-04: qty 10

## 2012-11-04 MED ORDER — OXYCODONE-ACETAMINOPHEN 5-325 MG PO TABS
1.0000 | ORAL_TABLET | ORAL | Status: DC | PRN
Start: 2012-11-04 — End: 2012-11-06

## 2012-11-04 MED ORDER — MEPERIDINE HCL 25 MG/ML IJ SOLN: 6.2500 mg | INTRAMUSCULAR | Status: DC | PRN

## 2012-11-04 MED ORDER — NALBUPHINE HCL 10 MG/ML IJ SOLN
5.0000 mg | INTRAMUSCULAR | Status: DC | PRN
  Filled 2012-11-04: qty 1

## 2012-11-04 MED ORDER — MENTHOL 3 MG MT LOZG: 1.0000 | LOZENGE | OROMUCOSAL | Status: DC | PRN

## 2012-11-04 MED ORDER — KETOROLAC TROMETHAMINE 30 MG/ML IJ SOLN: 30.0000 mg | Freq: Four times a day (QID) | INTRAMUSCULAR | Status: AC | PRN

## 2012-11-04 MED ORDER — IBUPROFEN 600 MG PO TABS: 600.0000 mg | ORAL_TABLET | Freq: Four times a day (QID) | ORAL | Status: DC | PRN

## 2012-11-04 MED ORDER — EPHEDRINE 5 MG/ML INJ
INTRAVENOUS | Status: AC
  Filled 2012-11-04: qty 10

## 2012-11-04 MED ORDER — LANOLIN HYDROUS EX OINT: 1.0000 "application " | TOPICAL_OINTMENT | CUTANEOUS | Status: DC | PRN

## 2012-11-04 MED ORDER — PHENYLEPHRINE HCL 10 MG/ML IJ SOLN
INTRAMUSCULAR | Status: DC | PRN
  Administered 2012-11-04: 80 ug via INTRAVENOUS

## 2012-11-04 MED ORDER — FENTANYL CITRATE 0.05 MG/ML IJ SOLN
INTRAMUSCULAR | Status: DC | PRN
  Administered 2012-11-04: 25 ug via INTRATHECAL

## 2012-11-04 MED ORDER — ZOLPIDEM TARTRATE 5 MG PO TABS: 5.0000 mg | ORAL_TABLET | Freq: Every evening | ORAL | Status: DC | PRN

## 2012-11-04 MED ORDER — FENTANYL CITRATE 0.05 MG/ML IJ SOLN
INTRAMUSCULAR | Status: AC
  Filled 2012-11-04: qty 2

## 2012-11-04 MED ORDER — ONDANSETRON HCL 4 MG PO TABS: 4.0000 mg | ORAL_TABLET | ORAL | Status: DC | PRN

## 2012-11-04 MED ORDER — SENNOSIDES-DOCUSATE SODIUM 8.6-50 MG PO TABS
2.0000 | ORAL_TABLET | Freq: Every day | ORAL | Status: DC
  Administered 2012-11-05 (×2): 2 via ORAL

## 2012-11-04 MED ORDER — IBUPROFEN 600 MG PO TABS
600.0000 mg | ORAL_TABLET | Freq: Four times a day (QID) | ORAL | Status: DC
  Administered 2012-11-05 – 2012-11-06 (×6): 600 mg via ORAL
  Filled 2012-11-04 (×7): qty 1

## 2012-11-04 MED ORDER — DEXTROSE 5 % IV SOLN
1.0000 ug/kg/h | INTRAVENOUS | Status: DC | PRN
  Filled 2012-11-04: qty 2

## 2012-11-04 MED ORDER — DIPHENHYDRAMINE HCL 25 MG PO CAPS: 25.0000 mg | ORAL_CAPSULE | Freq: Four times a day (QID) | ORAL | Status: DC | PRN

## 2012-11-04 MED ORDER — WITCH HAZEL-GLYCERIN EX PADS: 1.0000 "application " | MEDICATED_PAD | CUTANEOUS | Status: DC | PRN

## 2012-11-04 MED ORDER — 0.9 % SODIUM CHLORIDE (POUR BTL) OPTIME
TOPICAL | Status: DC | PRN
  Administered 2012-11-04: 1000 mL

## 2012-11-04 MED ORDER — KETOROLAC TROMETHAMINE 60 MG/2ML IM SOLN
60.0000 mg | Freq: Once | INTRAMUSCULAR | Status: AC | PRN
  Filled 2012-11-04: qty 2

## 2012-11-04 MED ORDER — SCOPOLAMINE 1 MG/3DAYS TD PT72: 1.0000 | MEDICATED_PATCH | Freq: Once | TRANSDERMAL | Status: DC

## 2012-11-04 MED ORDER — EPHEDRINE SULFATE 50 MG/ML IJ SOLN
INTRAMUSCULAR | Status: DC | PRN
  Administered 2012-11-04 (×2): 10 mg via INTRAVENOUS

## 2012-11-04 MED ORDER — TETANUS-DIPHTH-ACELL PERTUSSIS 5-2.5-18.5 LF-MCG/0.5 IM SUSP: 0.5000 mL | Freq: Once | INTRAMUSCULAR | Status: DC

## 2012-11-04 MED ORDER — SCOPOLAMINE 1 MG/3DAYS TD PT72
1.0000 | MEDICATED_PATCH | Freq: Once | TRANSDERMAL | Status: DC
  Administered 2012-11-04: 1.5 mg via TRANSDERMAL

## 2012-11-04 MED ORDER — DIBUCAINE 1 % RE OINT: 1.0000 "application " | TOPICAL_OINTMENT | RECTAL | Status: DC | PRN

## 2012-11-04 MED ORDER — SIMETHICONE 80 MG PO CHEW
80.0000 mg | CHEWABLE_TABLET | Freq: Three times a day (TID) | ORAL | Status: DC
  Administered 2012-11-05 – 2012-11-06 (×5): 80 mg via ORAL

## 2012-11-04 MED ORDER — LACTATED RINGERS IV SOLN
INTRAVENOUS | Status: DC
  Administered 2012-11-04 – 2012-11-05 (×3): via INTRAVENOUS

## 2012-11-04 MED ORDER — DIPHENHYDRAMINE HCL 50 MG/ML IJ SOLN: 12.5000 mg | INTRAMUSCULAR | Status: DC | PRN

## 2012-11-04 MED ORDER — ONDANSETRON HCL 4 MG/2ML IJ SOLN
INTRAMUSCULAR | Status: DC | PRN
  Administered 2012-11-04: 4 mg via INTRAVENOUS

## 2012-11-04 MED ORDER — ONDANSETRON HCL 4 MG/2ML IJ SOLN: 4.0000 mg | Freq: Three times a day (TID) | INTRAMUSCULAR | Status: DC | PRN

## 2012-11-04 MED ORDER — DIPHENHYDRAMINE HCL 25 MG PO CAPS: 25.0000 mg | ORAL_CAPSULE | ORAL | Status: DC | PRN

## 2012-11-04 MED ORDER — PRENATAL MULTIVITAMIN CH: 1.0000 | ORAL_TABLET | Freq: Every day | ORAL | Status: DC

## 2012-11-04 MED ORDER — FENTANYL CITRATE 0.05 MG/ML IJ SOLN: 25.0000 ug | INTRAMUSCULAR | Status: DC | PRN

## 2012-11-04 MED ORDER — ONDANSETRON HCL 4 MG/2ML IJ SOLN
INTRAMUSCULAR | Status: AC
  Filled 2012-11-04: qty 2

## 2012-11-04 MED ORDER — LEVOTHYROXINE SODIUM 112 MCG PO TABS
112.0000 ug | ORAL_TABLET | Freq: Every day | ORAL | Status: DC
  Administered 2012-11-04 – 2012-11-06 (×3): 112 ug via ORAL
  Filled 2012-11-04 (×3): qty 1

## 2012-11-04 MED ORDER — PRENATAL MULTIVITAMIN CH
1.0000 | ORAL_TABLET | Freq: Every day | ORAL | Status: DC
  Administered 2012-11-05 – 2012-11-06 (×2): 1 via ORAL
  Filled 2012-11-04 (×2): qty 1

## 2012-11-04 MED ORDER — DIPHENHYDRAMINE HCL 50 MG/ML IJ SOLN: 25.0000 mg | INTRAMUSCULAR | Status: DC | PRN

## 2012-11-04 MED ORDER — ONDANSETRON HCL 4 MG/2ML IJ SOLN: 4.0000 mg | INTRAMUSCULAR | Status: DC | PRN

## 2012-11-04 MED ORDER — METOCLOPRAMIDE HCL 5 MG/ML IJ SOLN: 10.0000 mg | Freq: Three times a day (TID) | INTRAMUSCULAR | Status: DC | PRN

## 2012-11-04 MED ORDER — OXYTOCIN 10 UNIT/ML IJ SOLN
40.0000 [IU] | INTRAVENOUS | Status: DC | PRN
  Administered 2012-11-04: 40 [IU] via INTRAVENOUS

## 2012-11-04 SURGICAL SUPPLY — 30 items
CLOTH BEACON ORANGE TIMEOUT ST (SAFETY) ×2 IMPLANT
DRAPE LG THREE QUARTER DISP (DRAPES) ×2 IMPLANT
DRESSING TELFA 8X3 (GAUZE/BANDAGES/DRESSINGS) IMPLANT
DRSG OPSITE POSTOP 4X10 (GAUZE/BANDAGES/DRESSINGS) ×2 IMPLANT
DURAPREP 26ML APPLICATOR (WOUND CARE) ×2 IMPLANT
ELECT REM PT RETURN 9FT ADLT (ELECTROSURGICAL) ×2
ELECTRODE REM PT RTRN 9FT ADLT (ELECTROSURGICAL) ×1 IMPLANT
EXTRACTOR VACUUM M CUP 4 TUBE (SUCTIONS) IMPLANT
GAUZE SPONGE 4X4 12PLY STRL LF (GAUZE/BANDAGES/DRESSINGS) IMPLANT
GLOVE BIO SURGEON STRL SZ8 (GLOVE) ×2 IMPLANT
GLOVE SURG ORTHO 8.0 STRL STRW (GLOVE) ×2 IMPLANT
GLOVE SURG SS PI 7.0 STRL IVOR (GLOVE) ×4 IMPLANT
GOWN PREVENTION PLUS LG XLONG (DISPOSABLE) ×6 IMPLANT
KIT ABG SYR 3ML LUER SLIP (SYRINGE) ×2 IMPLANT
NEEDLE HYPO 25X5/8 SAFETYGLIDE (NEEDLE) ×2 IMPLANT
NS IRRIG 1000ML POUR BTL (IV SOLUTION) ×2 IMPLANT
PACK C SECTION WH (CUSTOM PROCEDURE TRAY) ×2 IMPLANT
PAD ABD 7.5X8 STRL (GAUZE/BANDAGES/DRESSINGS) IMPLANT
PAD OB MATERNITY 4.3X12.25 (PERSONAL CARE ITEMS) ×2 IMPLANT
SLEEVE SCD COMPRESS KNEE MED (MISCELLANEOUS) IMPLANT
STAPLER VISISTAT 35W (STAPLE) ×2 IMPLANT
SUT MNCRL 0 VIOLET CTX 36 (SUTURE) ×3 IMPLANT
SUT MONOCRYL 0 CTX 36 (SUTURE) ×3
SUT PDS AB 1 CT  36 (SUTURE)
SUT PDS AB 1 CT 36 (SUTURE) IMPLANT
SUT VIC AB 1 CTX 36 (SUTURE) ×1
SUT VIC AB 1 CTX36XBRD ANBCTRL (SUTURE) ×1 IMPLANT
TOWEL OR 17X24 6PK STRL BLUE (TOWEL DISPOSABLE) ×6 IMPLANT
TRAY FOLEY CATH 14FR (SET/KITS/TRAYS/PACK) ×2 IMPLANT
WATER STERILE IRR 1000ML POUR (IV SOLUTION) ×2 IMPLANT

## 2012-11-04 NOTE — Anesthesia Postprocedure Evaluation (Signed)
  Anesthesia Post-op Note  Patient: Stacey Cuevas  Procedure(s) Performed: Procedure(s) with comments: CESAREAN SECTION (N/A) - Repeat edc 11/01/12   Patient is awake, responsive, moving her legs, and has signs of resolution of her numbness. Pain and nausea are reasonably well controlled. Vital signs are stable and clinically acceptable. Oxygen saturation is clinically acceptable. There are no apparent anesthetic complications at this time. Patient is ready for discharge.

## 2012-11-04 NOTE — Anesthesia Procedure Notes (Signed)

## 2012-11-04 NOTE — Op Note (Signed)
Cesarean Section Procedure Note  Pre-operative Diagnosis: IUP @ 40 weeks, prev C/S, Breech  Post-operative Diagnosis: same  Surgeon: Turner Daniels   Assistants: none  Anesthesia:Spinal  Procedure:  Low Segment Transverse cesarean section  Procedure Details  The patient was seen in the Holding Room. The risks, benefits, complications, treatment options, and expected outcomes were discussed with the patient.  The patient concurred with the proposed plan, giving informed consent.  The site of surgery properly noted/marked.. A Time Out was held and the above information confirmed.  After induction of anesthesia, the patient was draped and prepped in the usual sterile manner. A Pfannenstiel incision was made and carried down through the subcutaneous tissue to the fascia. Fascial incision was made and extended transversely. The fascia was separated from the underlying rectus tissue superiorly and inferiorly. The peritoneum was identified and entered. Peritoneal incision was extended longitudinally. The utero-vesical peritoneal reflection was incised transversely and the bladder flap was bluntly freed from the lower uterine segment. A low transverse uterine incision was made. Delivered from frank breech presentation was a baby with Apgar scores of 8 at one minute and 9 at five minutes. After the umbilical cord was clamped and cut cord blood was obtained for evaluation. The placenta was removed intact and appeared normal. The uterine outline, tubes and ovaries appeared normal. The uterine incision was closed with running locked sutures of 0 monocryl and imbricated with 0 monocryl. Hemostasis was observed. Lavage was carried out until clear. The peritoneum was then closed with 0 monocryl and rectus muscles plicated in the midline.  After hemostasis was assured, the fascia was then reapproximated with running sutures of 0 Vicryl. Irrigation was applied and after adequate hemostasis was assured, the skin was  reapproximated with staples.  Instrument, sponge, and needle counts were correct prior the abdominal closure and at the conclusion of the case. The patient received 2 grams cefotetan preoperatively.  Findings: Viable female, ph art pending  Estimated Blood Loss:  600cc         Specimens: Placenta was sent to L&D         Complications:  None

## 2012-11-04 NOTE — Transfer of Care (Signed)
Immediate Anesthesia Transfer of Care Note  Patient: Stacey Cuevas  Procedure(s) Performed: Procedure(s) with comments: CESAREAN SECTION (N/A) - Repeat edc 11/01/12  Patient Location: PACU  Anesthesia Type:Spinal  Level of Consciousness: awake, alert  and oriented  Airway & Oxygen Therapy: Patient Spontanous Breathing  Post-op Assessment: Report given to PACU RN and Post -op Vital signs reviewed and stable  Post vital signs: Reviewed and stable  Complications: No apparent anesthesia complications

## 2012-11-04 NOTE — Anesthesia Preprocedure Evaluation (Signed)

## 2012-11-04 NOTE — Anesthesia Postprocedure Evaluation (Signed)
  Anesthesia Post-op Note  Patient: Stacey Cuevas  Procedure(s) Performed: Procedure(s) with comments: CESAREAN SECTION (N/A) - Repeat edc 11/01/12  Patient Location: Mother/Baby  Anesthesia Type:Spinal  Level of Consciousness: awake  Airway and Oxygen Therapy: Patient Spontanous Breathing  Post-op Pain: mild  Post-op Assessment: Patient's Cardiovascular Status Stable and Respiratory Function Stable  Post-op Vital Signs: stable  Complications: No apparent anesthesia complications

## 2012-11-05 LAB — CBC
HCT: 28 % — ABNORMAL LOW (ref 36.0–46.0)
Hemoglobin: 9.4 g/dL — ABNORMAL LOW (ref 12.0–15.0)
MCH: 32.2 pg (ref 26.0–34.0)
MCHC: 33.6 g/dL (ref 30.0–36.0)

## 2012-11-05 NOTE — Progress Notes (Signed)
Subjective: Postpartum Day 1: Cesarean Delivery Patient reports tolerating PO.    Objective: Vital signs in last 24 hours: Temp:  [97.5 F (36.4 C)-98.8 F (37.1 C)] 98.5 F (36.9 C) (02/15 0755) Pulse Rate:  [65-82] 67 (02/15 0755) Resp:  [14-20] 20 (02/15 0755) BP: (80-131)/(49-70) 101/65 mmHg (02/15 0755) SpO2:  [94 %-100 %] 97 % (02/15 0755) Weight:  [175 lb (79.379 kg)] 175 lb (79.379 kg) (02/14 1153)  Physical Exam:  General: alert, cooperative and no distress Lochia: appropriate Uterine Fundus: firm Incision: healing well DVT Evaluation: No evidence of DVT seen on physical exam.   Recent Labs  11/04/12 0745 11/05/12 0510  HGB 11.3* 9.4*  HCT 33.6* 28.0*    Assessment/Plan: Status post Cesarean section. Doing well postoperatively.  Continue current care.  Eion Timbrook II,Aldene Hendon E 11/05/2012, 10:19 AM

## 2012-11-06 MED ORDER — IBUPROFEN 600 MG PO TABS: 600.0000 mg | ORAL_TABLET | Freq: Four times a day (QID) | ORAL | Status: AC

## 2012-11-06 MED ORDER — LANOLIN HYDROUS EX OINT: 1.0000 "application " | TOPICAL_OINTMENT | CUTANEOUS | Status: DC | PRN

## 2012-11-06 MED ORDER — OXYCODONE-ACETAMINOPHEN 5-325 MG PO TABS: 1.0000 | ORAL_TABLET | ORAL | Status: DC | PRN

## 2012-11-06 NOTE — Progress Notes (Signed)
Subjective: Postpartum Day 2: Cesarean Delivery Patient reports tolerating PO.    Objective: Vital signs in last 24 hours: Temp:  [97.8 F (36.6 C)-98.5 F (36.9 C)] 97.8 F (36.6 C) (02/16 0555) Pulse Rate:  [65-72] 65 (02/16 0555) Resp:  [18-20] 18 (02/16 0555) BP: (100-113)/(62-73) 100/62 mmHg (02/16 0555) SpO2:  [97 %] 97 % (02/15 0755)  Physical Exam:  General: alert, cooperative and no distress Lochia: appropriate Uterine Fundus: firm Incision: healing well DVT Evaluation: No evidence of DVT seen on physical exam.   Recent Labs  11/04/12 0745 11/05/12 0510  HGB 11.3* 9.4*  HCT 33.6* 28.0*    Assessment/Plan: Status post Cesarean section. Doing well postoperatively.  Discharge home with standard precautions and return to clinic in 4-6 weeks.  Michell Giuliano II,Shaleigh Laubscher E 11/06/2012, 7:01 AM

## 2012-11-06 NOTE — Discharge Summary (Signed)
Obstetric Discharge Summary Reason for Admission: cesarean section Prenatal Procedures: ultrasound Intrapartum Procedures: cesarean: low cervical, transverse Postpartum Procedures: none Complications-Operative and Postpartum: none Hemoglobin  Date Value Range Status  11/05/2012 9.4* 12.0 - 15.0 g/dL Final     REPEATED TO VERIFY     DELTA CHECK NOTED     HCT  Date Value Range Status  11/05/2012 28.0* 36.0 - 46.0 % Final    Physical Exam:  General: alert, cooperative and no distress Lochia: appropriate Uterine Fundus: firm Incision: healing well DVT Evaluation: No evidence of DVT seen on physical exam.  Discharge Diagnoses: Term Pregnancy-delivered  Discharge Information: Date: 11/06/2012 Activity: pelvic rest Diet: routine Medications: PNV, Ibuprofen and Percocet Condition: stable Instructions: refer to practice specific booklet Discharge to: home   Newborn Data: Live born female  Birth Weight: 8 lb 2.3 oz (3695 g) APGAR: 8, 9  Home with mother.  Jimia Gentles II,Daryan Cagley E 11/06/2012, 7:04 AM

## 2012-11-07 ENCOUNTER — Telehealth (HOSPITAL_COMMUNITY): Payer: Self-pay | Admitting: *Deleted

## 2012-11-07 ENCOUNTER — Encounter (HOSPITAL_COMMUNITY): Payer: Self-pay | Admitting: *Deleted

## 2012-11-07 NOTE — Telephone Encounter (Signed)
Resolve episode 

## 2012-12-01 ENCOUNTER — Ambulatory Visit (INDEPENDENT_AMBULATORY_CARE_PROVIDER_SITE_OTHER): Payer: 59 | Admitting: Surgery

## 2012-12-01 ENCOUNTER — Encounter (INDEPENDENT_AMBULATORY_CARE_PROVIDER_SITE_OTHER): Payer: Self-pay | Admitting: Surgery

## 2012-12-01 VITALS — BP 136/72 | HR 85 | Temp 97.4°F | Resp 18 | Ht 65.0 in | Wt 158.4 lb

## 2012-12-01 DIAGNOSIS — R2233 Localized swelling, mass and lump, upper limb, bilateral: Secondary | ICD-10-CM

## 2012-12-01 DIAGNOSIS — R229 Localized swelling, mass and lump, unspecified: Secondary | ICD-10-CM

## 2012-12-01 DIAGNOSIS — R2230 Localized swelling, mass and lump, unspecified upper limb: Secondary | ICD-10-CM | POA: Insufficient documentation

## 2012-12-01 NOTE — Progress Notes (Signed)
Re:   Stacey Cuevas DOB:   1970-12-18 MRN:   [id number]  ASSESSMENT AND PLAN: 1.  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  This is probably more inflammation than an infection.  There is really nothing to do until she finished breast feeding.  For now she wants to breast feed as long as she can.  I gave her literature on breast lumps.  The one question are the antibiotics doing any good - and I am not sure.  She will see me PRN.   2.  Post partum - 11/04/2012  C Section   G. Adkins/D. Rana Snare  Chief Complaint  Patient presents with  . New Evaluation    bil axillary lump   REFERRING PHYSICIAN: LOWE,DAVID C, MD  HISTORY OF PRESENT ILLNESS: Stacey Cuevas is a 42 y.o. (DOB: 08/20/1971)  asian  female whose primary care physician is LOWE,DAVID C, MD and comes to me today for bilateral axillary masses.  The patient is from [place]. She delivered her second son 14th of February 2014. She is pumping to feed her child. For her first child, she breast-fed for 11 months. She did have some swelling in both axilla during her first pregnancy and this seemed to resolve between pregnancies. This time she has more prominent axillary swelling and she is run fever. She's been treated with antibiotics at least once. The labs that I have, however, have never shown a leukocytosis. She's had no prior history of breast disease.  I tried to speak to her in my limited [place] - I'm not sure that I did very well.    Past Medical History  Diagnosis Date  . Hx of varicella   . AMA (advanced maternal age) multigravida 35+   . Hypothyroidism     Past Surgical History  Procedure Laterality Date  . Cesarean section    . Cesarean section N/A 11/04/2012    Procedure: CESAREAN SECTION;  Surgeon: Turner Daniels, MD;  Location: [facility];  Service: Obstetrics;  Laterality: N/A;  Repeat edc 11/01/12      Current Outpatient Prescriptions  Medication Sig Dispense Refill  . ibuprofen (ADVIL,MOTRIN)  600 MG tablet Take 1 tablet (600 mg total) by mouth every 6 (six) hours.  30 tablet  2   No current facility-administered medications for this visit.     No Known Allergies  REVIEW OF SYSTEMS: Skin:  No history of rash.  No history of abnormal moles. Infection:  No history of hepatitis or HIV.  No history of MRSA. Neurologic:  No history of stroke.  No history of seizure.  No history of headaches. Cardiac:  No history of hypertension. No history of heart disease.    No history of seeing a cardiologist. Pulmonary:  Does not smoke cigarettes.  No asthma or bronchitis.  No OSA/CPAP.  Endocrine:  No diabetes. No thyroid disease. Gastrointestinal:  No history of stomach disease.  No history of liver disease.  No history of gall bladder disease.  No history of pancreas disease.  No history of colon disease. Urologic: She has some lower abdominal pain when she urinates.  She's probably still getting over her C section. Musculoskeletal:  No history of joint or back disease. Hematologic:  No bleeding disorder.  No history of anemia.   Psycho-social:  The patient is oriented.     SOCIAL and FAMILY HISTORY: Married. Has one sone 40 years old. Works as Surveyor, minerals for [employer].  PHYSICAL EXAM: BP 136/72  Pulse 85  Temp(Src) 97.4 F (36.3 C) (Temporal)  Resp 18  Ht 5\' 5"  (1.651 m)  Wt 158 lb 6.4 oz (71.85 kg)  BMI 26.36 kg/m2  General: WN asian F who is alert and generally healthy appearing.  HEENT: Normal. Pupils equal. Neck: Supple. No mass.  No thyroid mass. Lymph Nodes:  No supraclavicular or cervical nodes. Lungs: Clear to auscultation and symmetric breath sounds. Heart:  RRR. No murmur or rub. Breast:  Right - Pigmented nipple.  4 cm right axillary mass with pigmentation of skin.  Not tender or fluctuant.  Left - Pigmented nipple.  5 cm right axillary mass with pigmentation of skin.  Not tender or fluctuant.  I [place] both axillary masses - but all I see is consistent with  glandular changes.  No abscess or cyst to aspirate or drain.  [Photos taken of US]  DATA REVIEWED: Notes from Dr. Rana Snare.  Ovidio Kin, MD,  [facility] [address] [place]  [address]   [place], [place]    [address] Phone:  [phone] FAX:  [phone]

## 2014-07-23 ENCOUNTER — Encounter (INDEPENDENT_AMBULATORY_CARE_PROVIDER_SITE_OTHER): Payer: Self-pay | Admitting: Surgery

## 2018-03-04 NOTE — Telephone Encounter (Signed)
Preadmission screen  

## 2021-06-27 LAB — COLOGUARD: COLOGUARD: NEGATIVE
